# Patient Record
Sex: Female | Born: 1995 | Hispanic: No | Marital: Single
Health system: Northeastern US, Academic
[De-identification: ages and names within clinical notes are randomized; demographics above are authoritative.]

## PROBLEM LIST (undated history)

## (undated) DIAGNOSIS — F509 Eating disorder, unspecified: Secondary | ICD-10-CM

---

## 2017-01-11 ENCOUNTER — Encounter: Payer: Self-pay | Admitting: Emergency Medicine

## 2017-01-11 ENCOUNTER — Emergency Department: Payer: Managed Care, Other (non HMO)

## 2017-01-11 ENCOUNTER — Emergency Department
Admission: EM | Admit: 2017-01-11 | Discharge: 2017-01-11 | Disposition: A | Discharge status: Home / Self Care | Payer: Managed Care, Other (non HMO) | Attending: Emergency Medicine | Admitting: Emergency Medicine

## 2017-01-11 DIAGNOSIS — R42 Dizziness and giddiness: Secondary | ICD-10-CM | POA: Insufficient documentation

## 2017-01-11 DIAGNOSIS — R079 Chest pain, unspecified: Secondary | ICD-10-CM | POA: Diagnosis not present

## 2017-01-11 DIAGNOSIS — Z79899 Other long term (current) drug therapy: Secondary | ICD-10-CM | POA: Diagnosis not present

## 2017-01-11 HISTORY — DX: Eating disorder, unspecified: F50.9

## 2017-01-11 LAB — URINALYSIS, COMPLETE (UACMP) WITH MICROSCOPIC
Bilirubin Urine: NEGATIVE
GLUCOSE, UA: NEGATIVE mg/dL
Hgb urine dipstick: NEGATIVE
KETONES UR: NEGATIVE mg/dL
Nitrite: NEGATIVE
PROTEIN: NEGATIVE mg/dL
Specific Gravity, Urine: 1.014 (ref 1.005–1.030)
pH: 7 (ref 5.0–8.0)

## 2017-01-11 LAB — CBC
HCT: 38.5 % (ref 35.0–47.0)
Hemoglobin: 13.2 g/dL (ref 12.0–16.0)
MCH: 29.3 pg (ref 26.0–34.0)
MCHC: 34.4 g/dL (ref 32.0–36.0)
MCV: 85.2 fL (ref 80.0–100.0)
PLATELETS: 186 10*3/uL (ref 150–440)
RBC: 4.51 MIL/uL (ref 3.80–5.20)
RDW: 13.3 % (ref 11.5–14.5)
WBC: 6.1 10*3/uL (ref 3.6–11.0)

## 2017-01-11 LAB — BASIC METABOLIC PANEL
Anion gap: 5 (ref 5–15)
BUN: 10 mg/dL (ref 6–20)
CALCIUM: 9.1 mg/dL (ref 8.9–10.3)
CO2: 28 mmol/L (ref 22–32)
Chloride: 106 mmol/L (ref 101–111)
Creatinine, Ser: 0.52 mg/dL (ref 0.44–1.00)
GFR calc Af Amer: >60 mL/min (ref >60)
GFR calc non Af Amer: >60 mL/min (ref >60)
GLUCOSE: 93 mg/dL (ref 65–99)
Potassium: 3.7 mmol/L (ref 3.5–5.1)
Sodium: 139 mmol/L (ref 135–145)

## 2017-01-11 LAB — TROPONIN I: Troponin I: <0.03 ng/mL (ref <0.03)

## 2017-01-11 LAB — MAGNESIUM: Magnesium: 1.9 mg/dL (ref 1.7–2.4)

## 2017-01-11 LAB — POCT PREGNANCY, URINE: Preg Test, Ur: NEGATIVE

## 2017-01-11 MED ORDER — IBUPROFEN 400 MG PO TABS
400.0000 mg | ORAL_TABLET | Freq: Once | ORAL | Status: AC
  Administered 2017-01-11: 400 mg via ORAL
  Filled 2017-01-11: qty 1

## 2017-01-11 NOTE — ED Notes (Signed)
AAOx3.  Skin warm and dry.  No SOB/ DOE.  NAD.  Resting comfortably.  Relaxed.

## 2017-01-11 NOTE — Discharge Instructions (Signed)

## 2017-01-11 NOTE — ED Triage Notes (Signed)
Patient ambulatory to triage with steady gait, without difficulty or distress noted; pt st awoke at 230am with left sided CP, and heart racing; denies hx of same

## 2017-01-11 NOTE — ED Notes (Signed)
AAOx3.  Skin warm and dry.  NAD 

## 2017-01-11 NOTE — ED Provider Notes (Signed)
[facility] Emergency Department Provider Note  ____________________________________________  Time seen: Approximately 7:24 AM  I have reviewed the triage vital signs and the nursing notes.   HISTORY  Chief Complaint Chest Pain   HPI Kerri Gay is a 21 y.o. female with a history of eating disorder who presents for evaluation of chest pain. Patient reports that she woke up 2:30 AM with palpitations and left-sided chest pain. She describes the pain as sharp, nonpleuritic, moderate, located in the left side of her chest, nonradiating, constant since this morning. She no longer has palpitations. She reports that she felt lightheaded with her palpitations but no nausea, vomiting, diaphoresis, shortness of breath. She has never had similar episode. She reports that her eating disorder is better control after she's been on Prozac for over a year however she still struggles with that. She was concerned because someone told her that people with eating disorders can develop heart problems at early age. She denies any family history of arrhythmias or heart disease. She denies personal history of blood clots, no recent travel or immobilization, no leg pain or swelling, no hemoptysis, no hormones. No URI symptoms. She reports that her father had a blood clot in the setting of the surgery but other than that no family history of blood clots. Patient reports that she's been having a hard time swallowing her pills and they seem to get stuck on the way down to her stomach and she feels that the pain is coming from that. She does not smoke or use drugs.She denies cough, congestion, fever, abdominal pain, nausea, vomiting, diarrhea, dysuria.  Past Medical History:  Diagnosis Date  . Eating disorder     There are no active problems to display for this patient.   History reviewed. No pertinent surgical history.  Prior to Admission medications   Not on File     Allergies Patient has no known allergies.  No family history on file.  Social History Social History  Substance Use Topics  . Smoking status: Never Smoker  . Smokeless tobacco: Never Used  . Alcohol use Yes    Review of Systems  Constitutional: Negative for fever. + Lightheadedness Eyes: Negative for visual changes. ENT: Negative for sore throat. Neck: No neck pain  Cardiovascular: + chest pain. Respiratory: Negative for shortness of breath. Gastrointestinal: Negative for abdominal pain, vomiting or diarrhea. Genitourinary: Negative for dysuria. Musculoskeletal: Negative for back pain. Skin: Negative for rash. Neurological: Negative for headaches, weakness or numbness. Psych: No SI or HI  ____________________________________________   PHYSICAL EXAM:  VITAL SIGNS: ED Triage Vitals  Enc Vitals Group     BP 01/11/17 0433 116/68     Pulse Rate 01/11/17 0433 72     Resp 01/11/17 0433 18     Temp 01/11/17 0433 98.7 F (37.1 C)     Temp Source 01/11/17 0433 Oral     SpO2 01/11/17 0433 100 %     Weight 01/11/17 0430 103 lb (46.7 kg)     Height 01/11/17 0430 5\' 5"  (1.651 m)     Head Circumference --      Peak Flow --      Pain Score 01/11/17 0430 7     Pain Loc --      Pain Edu? --      Excl. in GC? --     Constitutional: Alert and oriented. Well appearing and in no apparent distress. HEENT:      Head: Normocephalic and atraumatic.  Eyes: Conjunctivae are normal. Sclera is non-icteric. EOMI. PERRL      Mouth/Throat: Mucous membranes are moist.       Neck: Supple with no signs of meningismus. Cardiovascular: Regular rate and rhythm. No murmurs, gallops, or rubs. 2+ symmetrical distal pulses are present in all extremities. No JVD. Respiratory: Normal respiratory effort. Lungs are clear to auscultation bilaterally. No wheezes, crackles, or rhonchi.  Gastrointestinal: Soft, non tender, and non distended with positive bowel sounds. No rebound or  guarding. Genitourinary: No CVA tenderness. Musculoskeletal: Nontender with normal range of motion in all extremities. No edema, cyanosis, or erythema of extremities. Neurologic: Normal speech and language. Face is symmetric. Moving all extremities. No gross focal neurologic deficits are appreciated. Skin: Skin is warm, dry and intact. No rash noted. Psychiatric: Mood and affect are normal. Speech and behavior are normal.  ____________________________________________   LABS (all labs ordered are listed, but only abnormal results are displayed)  Labs Reviewed  URINALYSIS, COMPLETE (UACMP) WITH MICROSCOPIC - Abnormal; Notable for the following:       Result Value   Color, Urine YELLOW (*)    APPearance CLOUDY (*)    Leukocytes, UA LARGE (*)    Bacteria, UA RARE (*)    Squamous Epithelial / LPF 6-30 (*)    All other components within normal limits  URINE CULTURE  BASIC METABOLIC PANEL  CBC  TROPONIN I  MAGNESIUM  TROPONIN I  POCT PREGNANCY, URINE   ____________________________________________  EKG  ED ECG REPORT I, Nita Sickle, the attending physician, personally viewed and interpreted this ECG.  Normal sinus rhythm, rate of 70, normal intervals, normal axis, no ST elevations or depressions, diffuse T-wave flattening. No prior for comparison.  ____________________________________________  RADIOLOGY  CXR: Negative  ____________________________________________   PROCEDURES  Procedure(s) performed: None Procedures Critical Care performed:  None ____________________________________________   INITIAL IMPRESSION / ASSESSMENT AND PLAN / ED COURSE  21 y.o. female with a history of eating disorder who presents for evaluation of sharp non pleuritic constant left sided chest pain associated with palpitations.  Chest pain in a 21 y.o. female with low suspicion for cardiac (HEART score 0) or other serious etiology (including aortic dissection, pneumonia, pneumothorax,  or pulmonary embolism) based her history and physical exam in the ED today. PERC negative. EKG normal. Plan for labs including CBC, chemistries and troponin now and in 3 hours, CXR and re-evaluation for disposition. Will observe patient on cardiac monitor for any arrhythmias while in the ED and pain control.     Clinical Course as of Jan 11 822  Tue Jan 11, 2017  1610 Patient has normal electrolytes. Troponin 2 is negative. Monitor on telemetry with no arrhythmias. She is can be discharged home with follow-up with primary care doctor.  [CV]  0820 UA with regular bacteria and leuks. Patient has no symptoms of urinary tract infection. We'll hold off treatment at this time and sent for culture.  [CV]    Clinical Course User Index [CV] Nita Sickle, MD    Pertinent labs & imaging results that were available during my care of the patient were reviewed by me and considered in my medical decision making (see chart for details).    ____________________________________________   FINAL CLINICAL IMPRESSION(S) / ED DIAGNOSES  Final diagnoses:  Chest pain, unspecified type      NEW MEDICATIONS STARTED DURING THIS VISIT:  New Prescriptions   No medications on file     Note:  This document was prepared using Dragon  voice recognition software and may include unintentional dictation errors.    Nita Sicklearolina Deana Krock, MD 01/11/17 [phone]

## 2017-01-12 LAB — URINE CULTURE

## 2019-01-09 ENCOUNTER — Emergency Department: Payer: Managed Care, Other (non HMO)

## 2019-01-09 ENCOUNTER — Encounter: Payer: Self-pay | Admitting: Emergency Medicine

## 2019-01-09 ENCOUNTER — Emergency Department
Admission: EM | Admit: 2019-01-09 | Discharge: 2019-01-09 | Disposition: A | Discharge status: Home / Self Care | Payer: Managed Care, Other (non HMO) | Attending: Emergency Medicine | Admitting: Emergency Medicine

## 2019-01-09 ENCOUNTER — Other Ambulatory Visit: Payer: Self-pay

## 2019-01-09 DIAGNOSIS — S6992XA Unspecified injury of left wrist, hand and finger(s), initial encounter: Secondary | ICD-10-CM | POA: Diagnosis present

## 2019-01-09 DIAGNOSIS — Y998 Other external cause status: Secondary | ICD-10-CM | POA: Insufficient documentation

## 2019-01-09 DIAGNOSIS — Y929 Unspecified place or not applicable: Secondary | ICD-10-CM | POA: Diagnosis not present

## 2019-01-09 DIAGNOSIS — Y9351 Activity, roller skating (inline) and skateboarding: Secondary | ICD-10-CM | POA: Insufficient documentation

## 2019-01-09 DIAGNOSIS — S52125A Nondisplaced fracture of head of left radius, initial encounter for closed fracture: Secondary | ICD-10-CM | POA: Diagnosis not present

## 2019-01-09 MED ORDER — NAPROXEN 500 MG PO TABS
500.0000 mg | ORAL_TABLET | Freq: Once | ORAL | Status: AC
  Administered 2019-01-09: 500 mg via ORAL
  Filled 2019-01-09: qty 1

## 2019-01-09 MED ORDER — TRAMADOL HCL 50 MG PO TABS
50.0000 mg | ORAL_TABLET | Freq: Once | ORAL | Status: AC
  Administered 2019-01-09: 50 mg via ORAL
  Filled 2019-01-09: qty 1

## 2019-01-09 NOTE — ED Notes (Signed)
See triage note  Presents s/p fall from skateboard  Landed on left side and wrist  Possible deformity to wrist  Good pulses   Also having some pain to left hip and upper leg  Ambulates with slight limp

## 2019-01-09 NOTE — ED Provider Notes (Signed)
[facility] Emergency Department Provider Note ____________________________________________  Time seen: Approximately 5:31 PM  I have reviewed the triage vital signs and the nursing notes.   HISTORY  Chief Complaint Wrist Injury    HPI Kerri Gay is a 23 y.o. female who presents to the emergency department for evaluation and treatment of wrist pain. She fell off her skateboard and landed with left hand outstretched. She has pain in the left wrist and elbow. No alleviating measures attempted prior to arrival.  Past Medical History:  Diagnosis Date  . Eating disorder     There are no active problems to display for this patient.   History reviewed. No pertinent surgical history.  Prior to Admission medications   Not on File    Allergies Patient has no known allergies.  No family history on file.  Social History Social History   Tobacco Use  . Smoking status: Never Smoker  . Smokeless tobacco: Never Used  Substance Use Topics  . Alcohol use: Yes  . Drug use: Not on file    Review of Systems Constitutional: Negative for fever. Cardiovascular: Negative for chest pain. Respiratory: Negative for shortness of breath. Musculoskeletal: Positive for left elbow and wrist pain. Skin: Negative for open wound or lesion.  Neurological: Negative for decrease in sensation  ____________________________________________   PHYSICAL EXAM:  VITAL SIGNS: ED Triage Vitals  Enc Vitals Group     BP 01/09/19 1708 108/75     Pulse Rate 01/09/19 1708 86     Resp --      Temp 01/09/19 1708 98.2 F (36.8 C)     Temp Source 01/09/19 1708 Oral     SpO2 01/09/19 1708 98 %     Weight 01/09/19 1710 110 lb (49.9 kg)     Height 01/09/19 1710 5\' 5"  (1.651 m)     Head Circumference --      Peak Flow --      Pain Score 01/09/19 1710 10     Pain Loc --      Pain Edu? --      Excl. in GC? --     Constitutional: Alert and oriented. Well appearing  and in no acute distress. Eyes: Conjunctivae are clear without discharge or drainage Head: Atraumatic Neck: Supple. Respiratory: No cough. Respirations are even and unlabored. Musculoskeletal: Unable to fully extend left elbow secondary to pain. No obvious deformity of the elbow. No deformity of the left wrist or hand. Limited ROM of the left wrist due to pain. Neurologic: Motor and sensory function of the left upper extremity is intact.  Skin: No open wounds or lesions.  Psychiatric: Affect and behavior are appropriate.  ____________________________________________   LABS (all labs ordered are listed, but only abnormal results are displayed)  Labs Reviewed - No data to display ____________________________________________  RADIOLOGY  Image of the left wrist is negative for fracture. Image of the left elbow shows a nondisplaced radial head fracture. ____________________________________________   PROCEDURES  .Ortho Injury Treatment Date/Time: 01/09/2019 6:43 PM Performed by: Marguerita Merles, NT Authorized by: Chinita Pester, FNP   Consent:    Consent obtained:  Verbal   Consent given by:  Patient   Risks discussed:  Stiffness and restricted joint movementInjury location: elbow Location details: left elbow Injury type: fracture Fracture type: radial head Pre-procedure distal perfusion: normal Pre-procedure neurological function: normal Pre-procedure range of motion: reduced Manipulation performed: no Immobilization: sling Post-procedure distal perfusion: normal Post-procedure neurological function: normal Post-procedure range  of motion: unchanged Patient tolerance: Patient tolerated the procedure well with no immediate complications     ____________________________________________   INITIAL IMPRESSION / ASSESSMENT AND PLAN / ED COURSE  Kerri Gay is a 23 y.o. who presents to the emergency department for treatment and evaluation of left wrist and  elbow pain after mechanical, non-syncopal fall from her skateboard earlier today. Image of the wrist is reassuring, but the elbow shows a nondisplaced radial head fracture. She will be placed in a sling and given a prescription for tramadol and naprosyn. She is to follow up with orthopedics. She is to return to the ER for symptoms that change or worsen if unable to schedule an appointment.  Medications  naproxen (NAPROSYN) tablet 500 mg (500 mg Oral Given 01/09/19 1852)  traMADol (ULTRAM) tablet 50 mg (50 mg Oral Given 01/09/19 1852)    Pertinent labs & imaging results that were available during my care of the patient were reviewed by me and considered in my medical decision making (see chart for details).  _________________________________________   FINAL CLINICAL IMPRESSION(S) / ED DIAGNOSES  Final diagnoses:  Closed nondisplaced fracture of head of left radius, initial encounter    ED Discharge Orders    None       If controlled substance prescribed during this visit, 12 month history viewed on the [facility] prior to issuing an initial prescription for Schedule II or III opiod.   Chinita Pester, FNP 01/09/19 1919    Emily Filbert, MD 01/09/19 2011

## 2019-01-09 NOTE — Discharge Instructions (Signed)
You will need to wear the sling for a week. After a week, you need to start moving your arm, but avoid lifting for the next week or so.   I do not recommend that you skate until you are completely pain free.   Follow up with the orthopedic doctor. Call tomorrow to schedule a follow up appointment.

## 2019-01-09 NOTE — ED Triage Notes (Signed)
PT arrives with complaints of mechanical injury to left wrist 30 minutes prior to arrival. Pulse present in triage.

## 2019-01-09 NOTE — ED Notes (Signed)
Arm sling applied by NP. Patient tolerated well.

## 2019-08-03 IMAGING — CR DG ELBOW COMPLETE 3+V*L*
1 series · 4 of 4 positions shown · non-contrast
Comparison: None.

CLINICAL DATA: Patient fell from skateboard hurting left arm.

EXAM:
LEFT ELBOW - COMPLETE 3+ VIEW

[Series 2: x elbow ap left · 0.14mm/px · 4 of 4 slices shown]
[im 1/4]
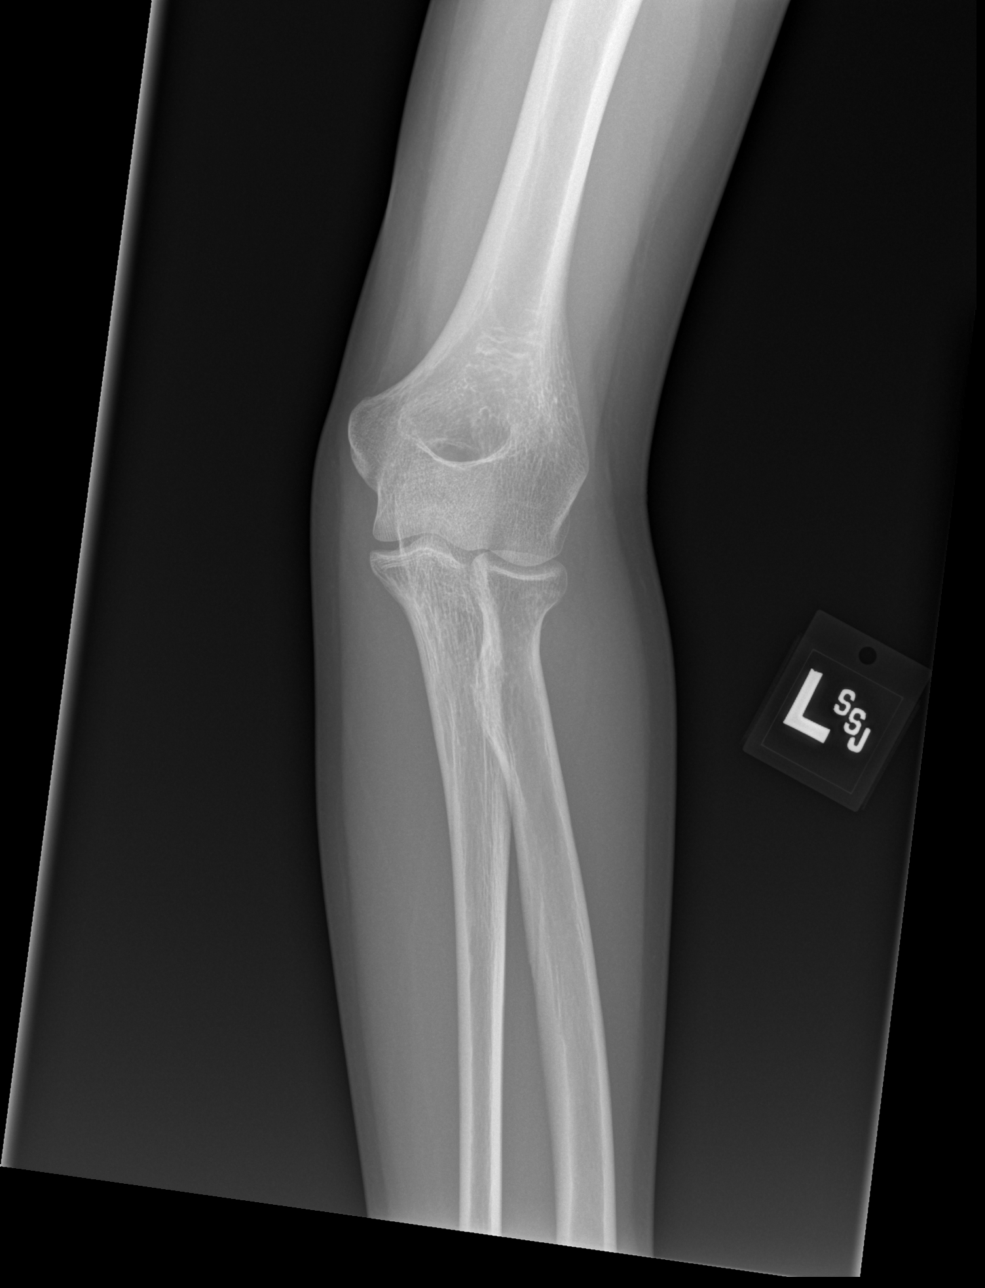
[im 2/4]
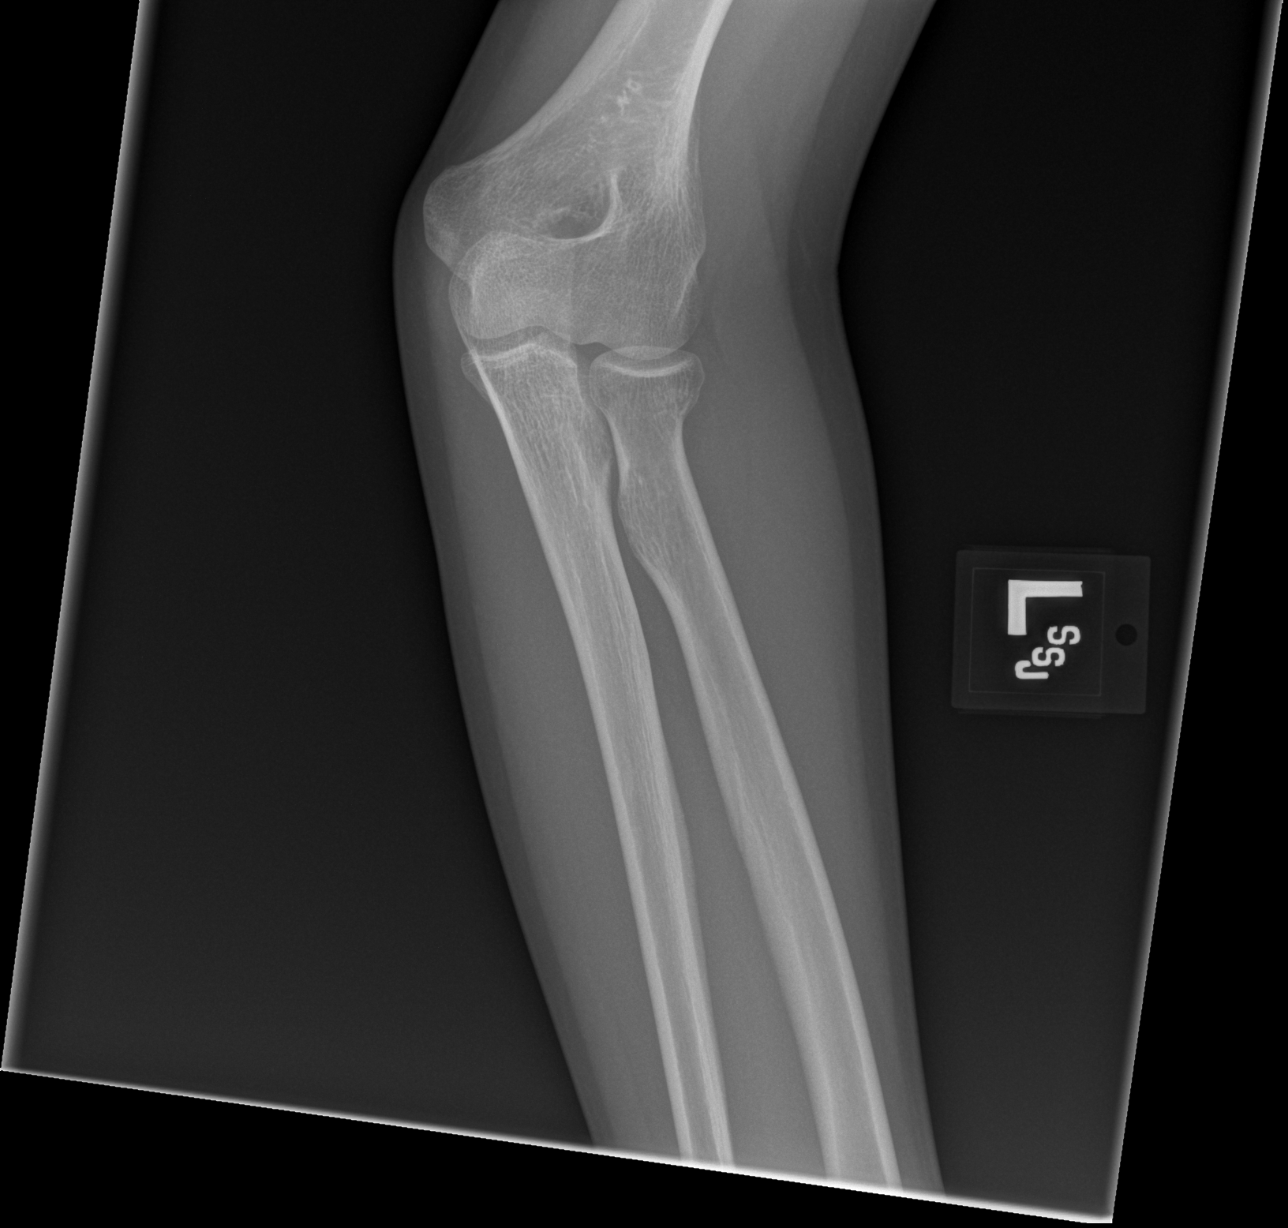
[im 3/4]
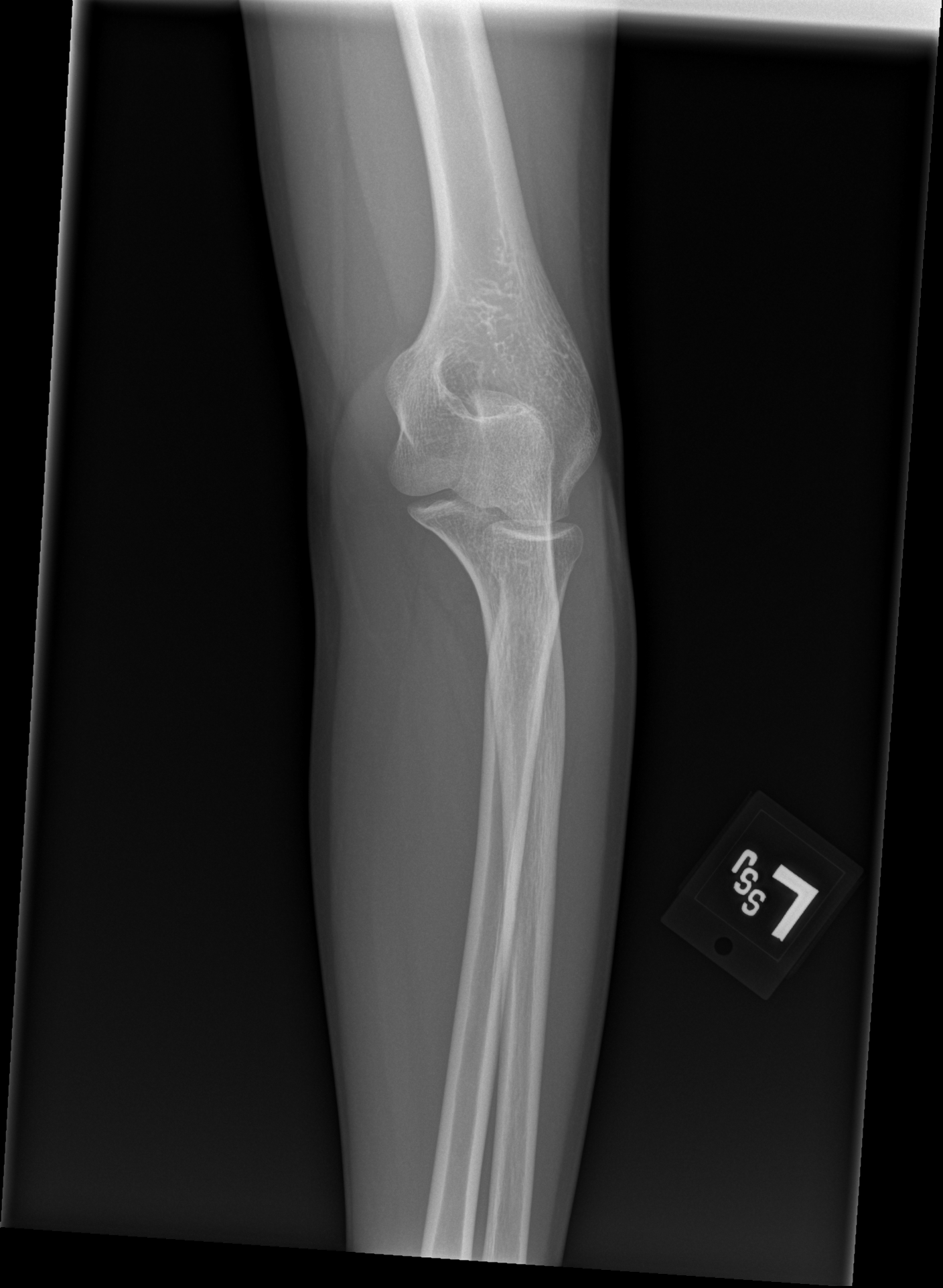
[im 4/4]
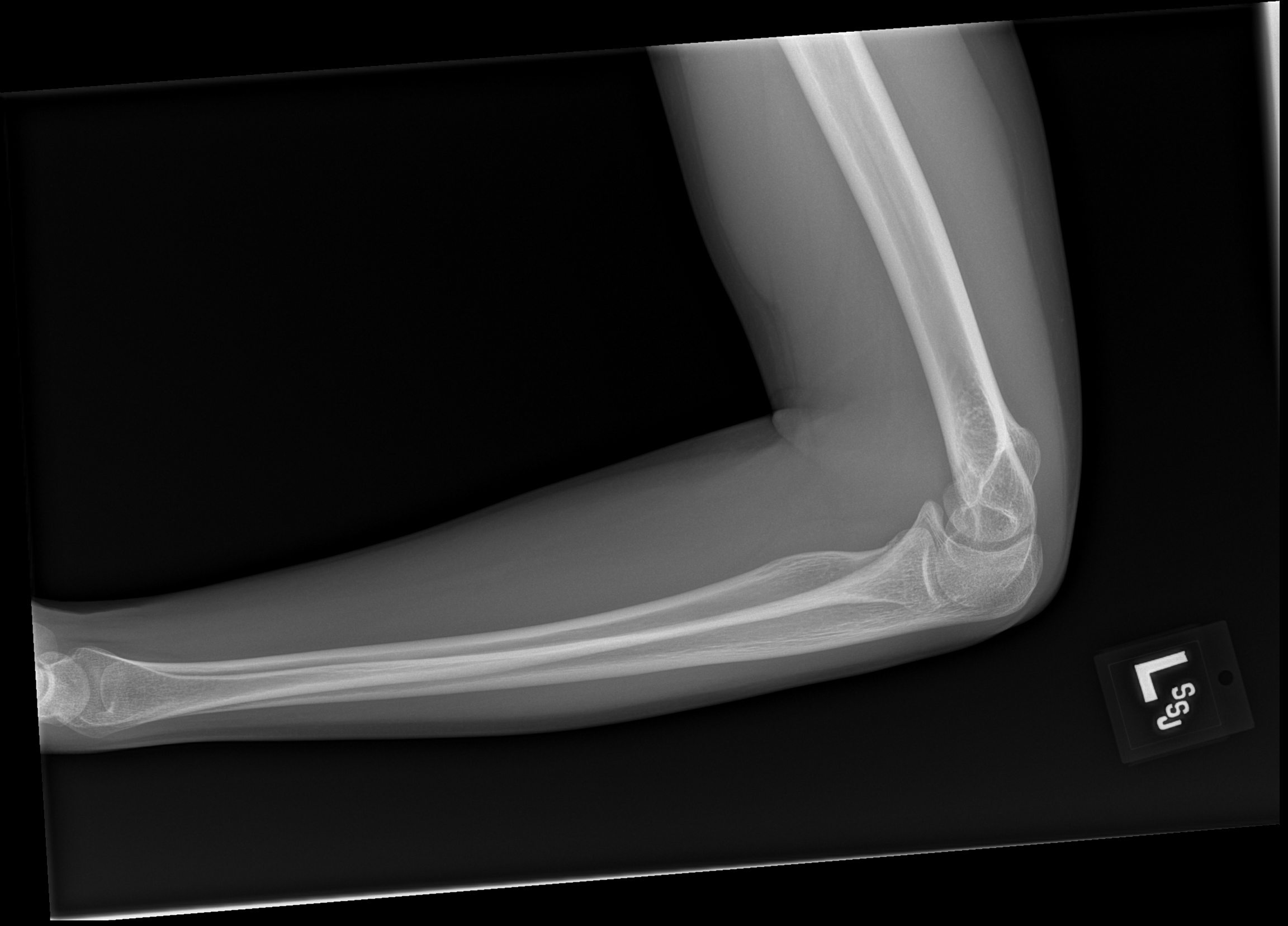

[4 of 4 positions shown; findings below may reference images not displayed]

FINDINGS: A subtle transverse lucency involving the neck of the radius is
noted without definite cortical break though there is slight
cortical irregularity. There is lack of a joint effusion. Findings
may represent an area of osteopenia though the possibility of a
nondisplaced fracture can not be entirely excluded. No joint
dislocation, soft tissue mass, mineralization or significant soft
tissue swelling is seen.
IMPRESSION: Subtle transverse lucency involving the neck of the radius without
displacement or apparent joint effusion. Osteopenia versus
nondisplaced fracture. Follow-up radiographs in 7-10 days, CT or MRI
may prove useful in further assessment as clinically necessary.

## 2019-08-03 IMAGING — DX DG WRIST COMPLETE 3+V*L*
4 series · 4 of 4 positions shown · non-contrast
Comparison: None

CLINICAL DATA: Fell off skateboard 1 hour ago landing on LEFT
wrist, pain at wrist radiating up arm

EXAM:
LEFT WRIST - COMPLETE 3+ VIEW

[wrist ap (1 of 2)]
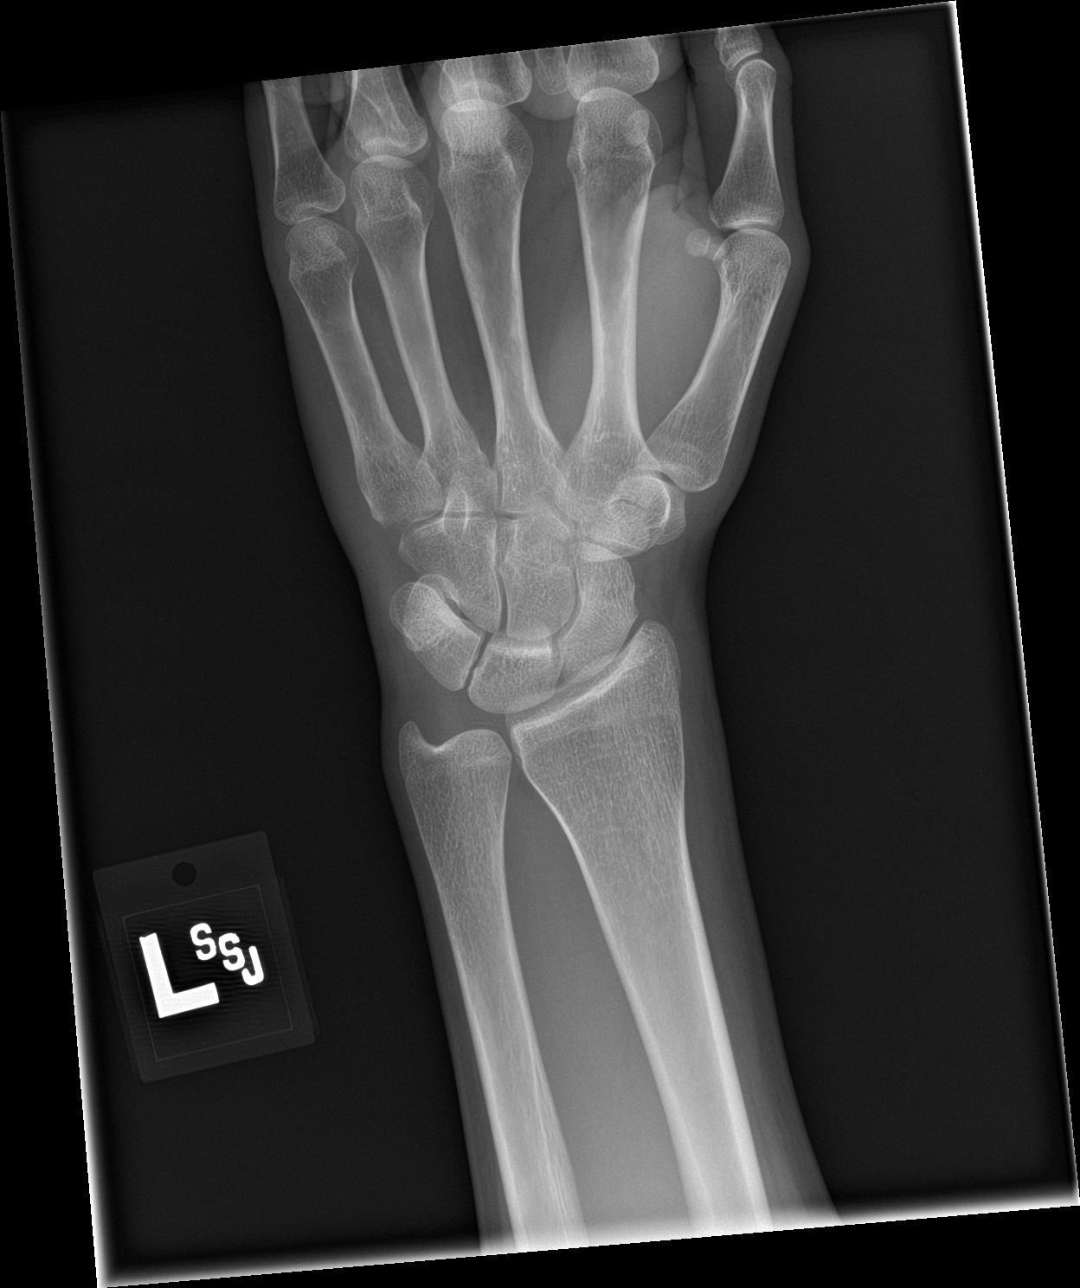

[wrist obl]
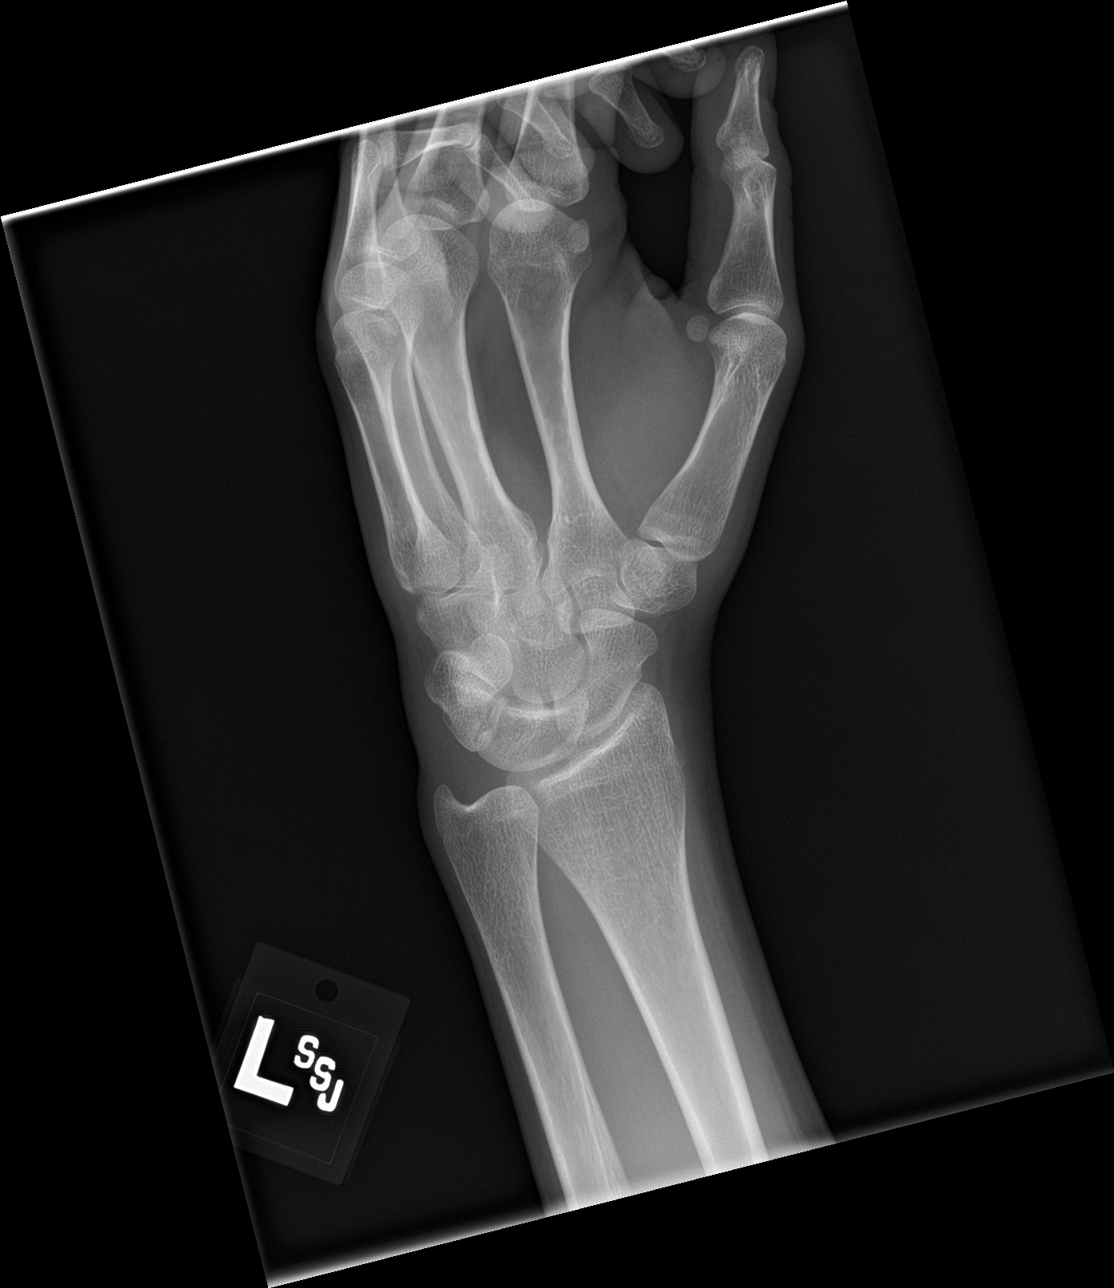

[wrist lat]
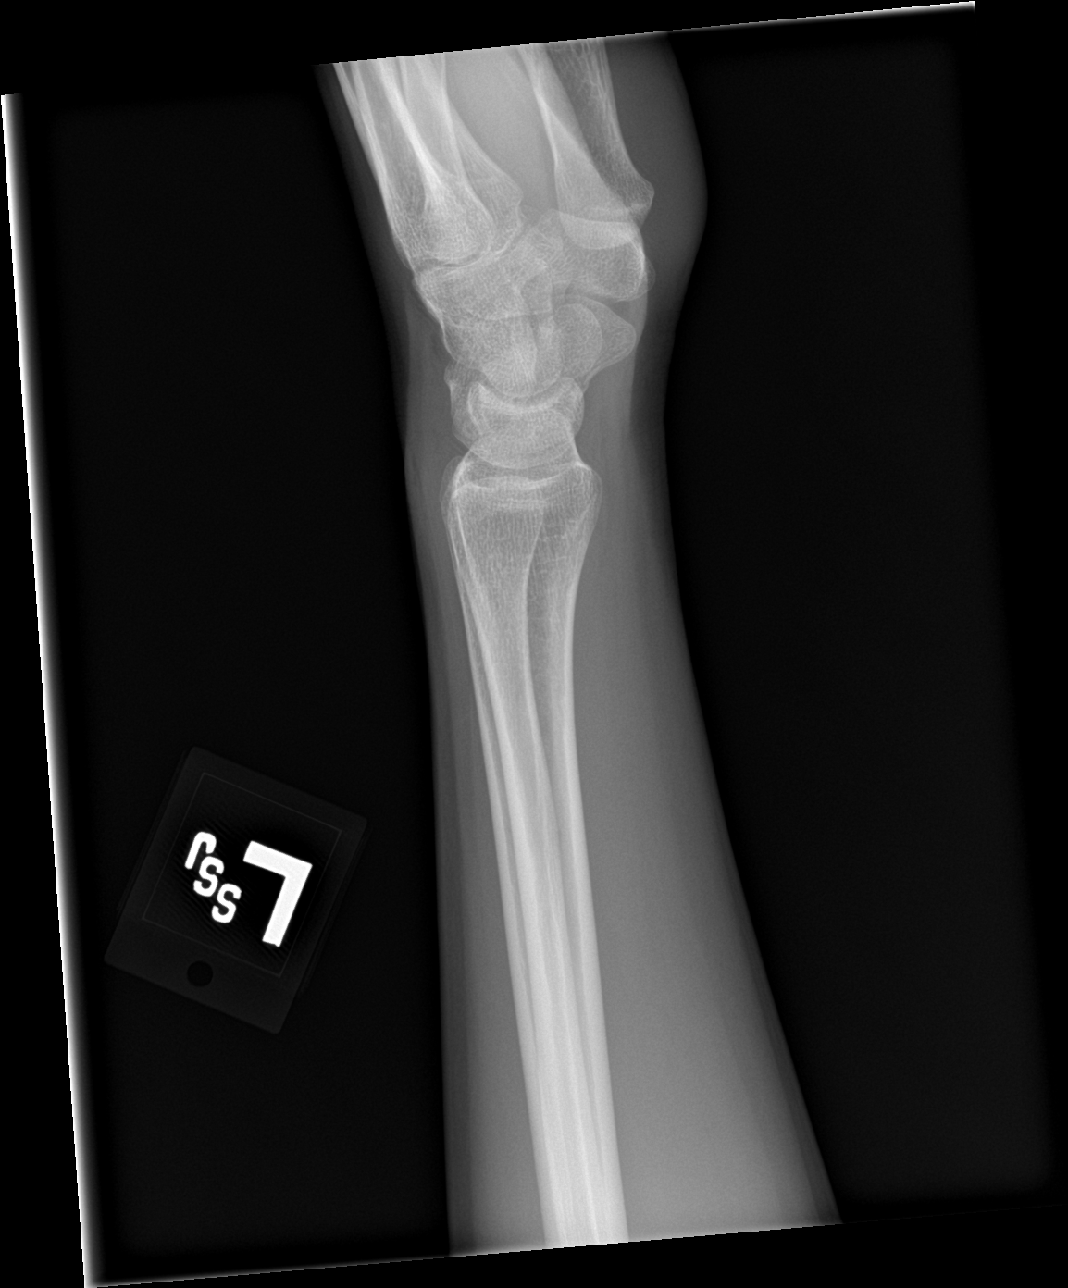

[wrist ap (2 of 2)]
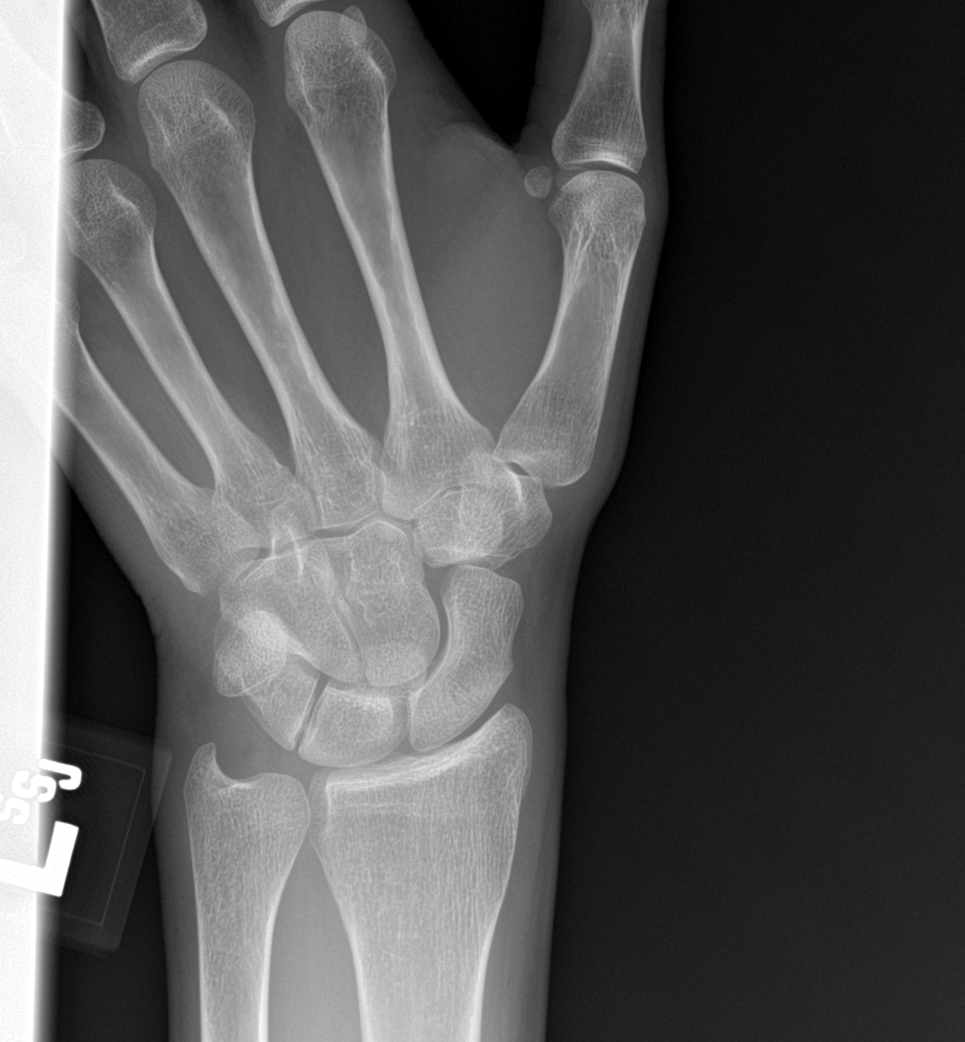

[4 of 4 positions shown; findings below may reference images not displayed]

FINDINGS: Osseous mineralization normal.

Joint spaces preserved.

No fracture, dislocation, or bone destruction.
IMPRESSION: Normal exam.

## 2020-08-13 ENCOUNTER — Encounter: Admit: 2020-08-13 | Payer: PRIVATE HEALTH INSURANCE | Attending: Psychiatry | Primary: Adolescent Medicine

## 2020-08-13 ENCOUNTER — Inpatient Hospital Stay: Admit: 2020-08-13 | Discharge: 2020-08-15 | Payer: PRIVATE HEALTH INSURANCE | Primary: Adolescent Medicine

## 2020-08-13 DIAGNOSIS — Z789 Other specified health status: Secondary | ICD-10-CM

## 2020-08-13 DIAGNOSIS — F332 Major depressive disorder, recurrent severe without psychotic features: Secondary | ICD-10-CM

## 2020-08-13 DIAGNOSIS — F333 Major depressive disorder, recurrent, severe with psychotic symptoms: Secondary | ICD-10-CM

## 2020-08-13 LAB — COMPREHENSIVE METABOLIC PANEL
BKR A/G RATIO: 1.3
BKR ALANINE AMINOTRANSFERASE (ALT): 22 U/L (ref 12–78)
BKR ALBUMIN: 4.1 g/dL (ref 3.4–5.0)
BKR ALKALINE PHOSPHATASE: 72 U/L (ref 20–120)
BKR ANION GAP: 9 % (ref 5–18)
BKR ASPARTATE AMINOTRANSFERASE (AST): 14 U/L (ref 5–37)
BKR AST/ALT RATIO: 0.6
BKR BILIRUBIN TOTAL: 0.9 mg/dL (ref 0.0–1.0)
BKR BLOOD UREA NITROGEN: 13 mg/dL (ref 8–25)
BKR BUN / CREAT RATIO: 20.3 % (ref 8.0–25.0)
BKR CALCIUM: 10.2 mg/dL (ref 8.4–10.3)
BKR CHLORIDE: 106 mmol/L (ref 95–115)
BKR CO2: 23 mmol/L (ref 21–32)
BKR CREATININE: 0.64 mg/dL (ref 0.50–1.30)
BKR EGFR (AFR AMER): >60 mL/min/{1.73_m2} (ref >60)
BKR EGFR (NON AFRICAN AMERICAN): >60 mL/min/{1.73_m2} (ref >60)
BKR GLOBULIN: 3.1 g/dL
BKR GLUCOSE: 85 mg/dL (ref 70–100)
BKR OSMOLALITY CALCULATION: 275 mOsm/kg (ref 275–295)
BKR POTASSIUM: 3.8 mmol/L (ref 3.5–5.1)
BKR PROTEIN TOTAL: 7.2 g/dL (ref 6.4–8.2)
BKR SODIUM: 138 mmol/L (ref 136–145)
BKR WAM BASOPHIL ABSOLUTE COUNT: 3.1 g/dL (ref 0.0–0.6)
BKR WAM MCV: 106 mmol/L (ref 95–115)
BKR WAM NEUTROPHILS: 10.2 mg/dL (ref 8.4–10.3)

## 2020-08-13 LAB — COVID-19 CLEARANCE OR FOR PLACEMENT ONLY: BKR SARS-COV-2 RNA (COVID-19) (YH): NEGATIVE

## 2020-08-13 LAB — CBC WITH AUTO DIFFERENTIAL
BKR WAM ABSOLUTE IMMATURE GRANULOCYTES: 0 x 1000/ÂµL (ref 0.0–0.2)
BKR WAM ABSOLUTE LYMPHOCYTE COUNT: 1.2 x 1000/ÂµL (ref 1.0–2.3)
BKR WAM ABSOLUTE NRBC: 0 x 1000/ÂµL (ref <=0.0)
BKR WAM ANALYZER ANC: 3.5 x 1000/ÂµL (ref 1.8–7.3)
BKR WAM BASOPHILS: 0.8 % (ref 0.0–2.0)
BKR WAM EOSINOPHIL ABSOLUTE COUNT: 0 x 1000/ÂµL (ref 0.0–0.4)
BKR WAM EOSINOPHILS: 0.2 % (ref 0.0–6.0)
BKR WAM HEMATOCRIT: 40 % (ref 36.0–48.0)
BKR WAM HEMOGLOBIN: 13.7 g/dL (ref 11.9–16.0)
BKR WAM IMMATURE GRANULOCYTES: 0.2 % (ref 0.0–2.0)
BKR WAM LYMPHOCYTES: 23 % (ref 14.0–43.0)
BKR WAM MCH (PG): 29.1 pg (ref 25.7–31.0)
BKR WAM MCHC: 34.3 g/dL (ref 32.0–36.0)
BKR WAM MONOCYTE ABSOLUTE COUNT: 0.3 x 1000/??L — ABNORMAL LOW (ref 0.4–1.3)
BKR WAM MONOCYTES: 6.1 % (ref 0.0–14.0)
BKR WAM MPV: 10.1 fL (ref 9.8–12.3)
BKR WAM NUCLEATED RED BLOOD CELLS: 0 % (ref 0.0–0.2)
BKR WAM PLATELETS: 208 x1000/ÂµL (ref 140–446)
BKR WAM RDW-CV: 13 % (ref 11.5–14.5)
BKR WAM RED BLOOD CELL COUNT: 4.7 M/??L (ref 4.2–5.4)
BKR WAM WHITE BLOOD CELL COUNT: 5.1 x1000/ÂµL (ref 3.8–10.6)

## 2020-08-13 LAB — ZZZURINE DRUG SCREEN      (GH)
BKR AMPHETAMINE SCREEN, URINE: NEGATIVE
BKR BARBITURATE SCREEN, URINE: NEGATIVE
BKR BENZODIAZEPINE SCREEN, URINE: NEGATIVE
BKR CANNABINOID SCREEN, URINE: NEGATIVE
BKR COCAINE SCREEN, URINE: NEGATIVE
BKR ETHANOL URINE: NEGATIVE
BKR METHADONE SCREEN, URINE: NEGATIVE
BKR OPIATE SCREEN, URINE: NEGATIVE
BKR PHENCYCLIDINE SCREEN, URINE: NEGATIVE

## 2020-08-13 LAB — SALICYLATE LEVEL: BKR SALICYLATE LEVEL: <1.7 mg/dL — ABNORMAL LOW (ref 2.8–20.0)

## 2020-08-13 LAB — HCG, SERUM, QUALITATIVE (BH GH L LMW): BKR SERUM PREGNANCY-QUALITATIVE: NEGATIVE

## 2020-08-13 LAB — ACETAMINOPHEN LEVEL: BKR ACETAMINOPHEN LEVEL: <2 ug/mL — ABNORMAL LOW (ref 10–30)

## 2020-08-13 LAB — ETHANOL     (BH GH L LMW YH): BKR ETHANOL: <3 mg/dL (ref 0–3)

## 2020-08-13 MED ORDER — OLANZAPINE 2.5 MG TABLET
2.5 mg | Freq: Two times a day (BID) | ORAL | Status: DC
Start: 2020-08-13 — End: 2020-08-15
  Administered 2020-08-14 – 2020-08-15 (×4): 2.5 mg via ORAL

## 2020-08-13 NOTE — Other
7:45 PMI received a call from the patient's psychiatrist, Janalyn Rouse [phone], who is requesting that the psychiatrist call him tomorrow morning.Reports that the patient was paranoid and psychotic this morning. Katha Cabal [place], MD09/22/21 1946

## 2020-08-14 MED ORDER — FLUOXETINE 10 MG CAPSULE
10 mg | Freq: Every day | ORAL | Status: DC
Start: 2020-08-14 — End: 2020-08-15
  Administered 2020-08-14 – 2020-08-15 (×2): 10 mg via ORAL

## 2020-08-14 NOTE — Other
[facility] EMERGENCY DEPTEmergency Department Psychiatry Progress Note9/23/2021Patient Name:  Brooke StaffordMRN:  [id number] Patient Class:  ObservationSUBJECTIVE Interim History: Met with patient and father this morning.  Case was discussed with clinical team.  Writer spoke with patient's outside psychiatrist Dr. Janalyn Rouse [phone] who provided additional information.  No changes.  No improvement.  Patient remained depressed, angry, irritable, labile, disorganized and paranoid.  Refusing Zyprexa.  Later patient agreed to take it.  It took a lots of time to convince patient to take her medication.  Father was supportive.  Treatment plan discussed with the patient, family, treatment team and outpatient psychiatrist Dr. Deretha Emory.  Admission plan is still in place. Still no beds available at this point. Possible bed availability at [facility] tomorrow. Will increase Zyprexa to 5 mg po BID. Will resume Prozac 10 mg po daily.Review of Systems Psychiatric/Behavioral: Positive for agitation, behavioral problems, decreased concentration, dysphoric mood and suicidal ideas. The patient is nervous/anxious.  OBJECTIVE I have reviewed the patient's current medications, allergies, past medical history, past surgical history, family history, social history and inpatient psychiatric admission.Current MedicationsCurrent Facility-Administered Medications Medication Dose Route Frequency Provider Last Rate Last Admin ? FLUoxetine (PROzac) capsule 10 mg  10 mg Oral Daily Tajanay Hurley, MD     ? OLANZapine (ZyPREXA) tablet 2.5 mg  2.5 mg Oral BID Stedman [place], MD   2.5 mg at 08/14/20 1140 No current outpatient medications on file. Vital SignsVitals:  08/14/20 1038 BP: 105/71 Pulse: 81 Resp: (!) 12 Temp: 98.2 ?F (36.8 ?C) Mental Status ExamGeneral AppearanceHabitus:  ThinGrooming: FairMusculoskeletalStrength and Tone: Strength normalGait and Station: Stable gaitPsychiatricAttitude: UncooperativeMood: depressed, anxious, apathetic, irritableAffect: Congruent to reported moodThought Process: circumstantial, disorganized, thought blocking and impoverishedAssociations: loose associationsThought Content: paranoid delusions, ideas of referenceSuicidal Ideation: No current suicidal plan, ideation or intentHomicidal Ideation: No current homicidal ideation, plan or intentJudgment:  PoorInsight:  PoorCognitive EvaluationOrientation: Oriented to person, oriented to place and oriented to date/time Attention and Concentration:  Decreased attention and concentrationAbstract Reasoning: Decreased capacity for abstract reasoningLab Results (last 24 hours)No results found for this or any previous visit (from the past 24 hour(s)).ASSESSMENT [place] - Suicide Severity Screen    Most Recent Value Have you wished you were dead or wished you could go to sleep and not wake up? no Have you actually had any thoughts of killing yourself? no Have you ever done anything, started to do anything, or prepared to do anything to end your life? 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  Suicide Attempt:  No Prior Attempts  Presenting Symptoms:  Hopelessness or Despair:  Yes  Anxiety and/or Panic:  Yes  Psychosis:  Yes  Family History:      Defer for Further Assessment  Precipitants / Stressors:      Unable to Assess Change in Treatment:       Unable to AssessCurrent and Past Psychiatric Diagnoses:  Mood Disorder:  Recurrent/Current  Psychotic Disorder:  New Onset  Alcohol/Substance Abuse Disorders:  Recurrent/Current  ADHD:  Recurrent/Current  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  Suicide Attempt:  No Prior AttemptsProtective Factors:   Internal:  Unable to Assess  External:      Unable to AssessRisk to Self - Self-Injurious Behavior:   Current Urges to harm Self:  No  Recent Self-Injury:  No  History of Self Injury:  No  Imminent Risk for Self Injury in Community:  Low  Imminent Risk for Self Injury in Facility:  LowRisk to Others:   Current Agitation:  No  Homicidal/Aggressive Ideation:  No  Homicidal/Aggressive Threat/Plan:  No  Recent Violence/Aggression:  No  Imminent Risk for Violence in Community:  Low  Imminent Risk for Violence in Facility:  LowWithdrawal Risk:   Alcohol / Benzodiazepines / Barbiturates:  Not applicable     Alcohol/Benzodiazepine/Barbiturate Risk for Withdrawal:  Absent  Opioids:  Not applicable     Opioid Risk for Withdrawal:  AbsentPsychiatric DiagnosisActive Hospital Problems  Diagnosis ? 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 Alcohol use [Z72.89]   Chronic PLAN Continue with admission planResume Prozac 10 mg po dailyIncrease Zyprexa 5 mg po BIDLegal Status:  Physicians Emergency Certificate [facility]) - InpatientElectronically SignedProvider:  Sheffield Hawker, MD9/23/20213:27 PM

## 2020-08-14 NOTE — Other
EMERGENCY DEPARTMENT	                          Behavioral Health Unit Observation NoteChief Complaint Patient presents with ? Depression   non compliant with prozac   has been speaking to psychiatrist all week but not tellin him truth about feeling more depressed and not wanting to live this life anymore. no plan arrives awake and alert no eye contact speaking softly. Past Medical History: Diagnosis Date ? Alcohol use 08/14/2020 ? Severe episode of recurrent major depressive disorder, with psychotic features (HC Code) 08/13/2020 ? Severe episode of recurrent major depressive disorder, without psychotic features (HC Code) 08/13/2020 Home medications:Patient's Medications  No medications on file Current medications:Medications OLANZapine (ZyPREXA) tablet 2.5 mg (2.5 mg Oral Given 08/13/20 2140) Allergies as of 08/13/2020 ? (No Known Allergies) The patient's home medication list has been reviewed.________________________________________________________________________Physical exam: Vital signs:	BP 108/65  - Pulse 78  - Temp 98.1 ?F (36.7 ?C) (Oral)  - Resp 16  - SpO2 98% 	General Appear:	alert, no distress, well nourished and well developed	Neurologic:		is nonfocal and is alert and oriented x3. Gait normal. 	Plan:Home medications were reviewed and ordered: yesPatient is in our Behavioral Health Unit.Patient has been medically cleared.Psychiatry consulted.The patient is located in our Behavioral Health Unit.  They are not at high risk for suicide/self-harm due to the risk mitigation interventions within this unit. Altamese Cabal, MD09/23/21 [phone]

## 2020-08-14 NOTE — ED Notes
10:29 PM Received report from prior RN, assumed care, pt updated on plan and disposition. Pt appears in no respiratory distress, respirations even and unlabored. Denies SI, reports feeling fine. All due medications as ordered, questioning medication change from her normal medication Prozac. Will continue to assess.
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
## 2020-08-14 NOTE — Other
Patient evaluated by DR.Kurjakovic. PEC'd for in-patient psych admission. [employer]. Will start bed search.[facility]- fax clinical for review 345pm patient accepted for admission 08/15/20 - requesting updated Covid, vitals, nursing report, and insurance authorization [place]- [place] bed [place]-per Morrie Sheldon- no bed  [facility]- per Diannia Ruder- will call back after 10 am[place]- per Diannia Ruder- no bed [facility] [place]  [facility]- per [facility]- per Cristal Ford- call back after 1030am[facility] per Marlowe Sax- [facility] bed[facility]- per [facility]- no bed [place]- per Casimiro Needle- no bed Will complete transfer in am. Morrie Sheldon Psych Case Management [phone]

## 2020-08-15 LAB — COVID-19 CLEARANCE OR FOR PLACEMENT ONLY: BKR SARS-COV-2 RNA (COVID-19) (YH): NEGATIVE

## 2020-08-15 MED ORDER — FLUOXETINE 20 MG CAPSULE
20 mg | Status: DC
Start: 2020-08-15 — End: 2020-08-15

## 2020-08-15 NOTE — ED Notes
7:40 AM Assumed care. Pt sleeping. NAD/NARD. [id number]: Pt crying sitting on bed. Stated she did not want Zyprexa because she did not like how it made her feel. After explaining reason for medication, pt agreed to take it. Visualized pt swallowing medications. Confirmed medications were swallowed by checking mouth. Pt stated I just want to get out of here. Door closed d/t other BHU pt roaming hall and standing in front of BHU1's door. Safety maintained.

## 2020-08-15 NOTE — ED Notes
Handoff given to EMS crew. Pt belongings given to her. Safely left with EMS crew

## 2020-08-15 NOTE — ED Notes
10:02 PM PT A&O, behavior appropriate to situation, denies SI/ HI. Pt mentioned that she feels much better being here in the quiet. With hesitation took her zyprexa, mentioned that she wishes to talk with someone about stoping taking it and it makes her feel foggy.

## 2020-08-15 NOTE — ED Notes
6:55 AM pt report given to day RN, care deferred plan to go IP facility covid swab completed at 9 PM overnight second swab also negative

## 2020-08-15 NOTE — Other
REVIEW OF PHARMACY RECORDS FOR BHU PATIENT I have reviewed the pharmacy records for recent medications being filled at the pharmacy for this patient. Please see below for medication name, strength, formulation, dosage, route, frequency, fill date, and day supply.  Medication Sig Pharmacy Fill History Records Fluoxetine HCl 10 mg tablet Take one tablet by mouth at bedtime as directed. Start with one-half tablet for 2 days.  Last filled on 07/06/2020, #30 tablets for 30-day supply.  Kamdin Follett Myrene Galas, PharmD10:35 AM9/24/2021Phone: Available via Mobile [employer]

## 2020-08-15 NOTE — ED Notes
Patient calm and cooperative. Father visited patient without incident. Patient slept through the night.

## 2020-08-15 NOTE — Other
[facility]- patient accepted for admission. Patient to arrive [address] Authorized Paula Libra [phone] opt 3Authorization # [id number]# of day 3Starting 08/15/20 -Ending 08/17/20 , Review 08/18/20 Spoke with patient and father - explained role and transfer to in-patient psych admission. Patient and father accepted. Nursing report- [phone]- per Fleet [facility]ras- ready Mal Amabile Psych Case Management [phone]
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
## 2020-09-06 NOTE — Other
[facility] EMERGENCY DEPTEmergency Department Psychiatry Progress Note9/24/2021Patient Name:  Brooke StaffordMRN:  [id number] Patient Class:  ObservationSUBJECTIVE Interim History: Reviewed overnight clinical entries. Met with patient and father this morning. Discussed with clinical team. Patient remained depressed, quiet and withdrawn. Although patient is in better behavioral control she still remained unpredictable. She was accepted to inpatient psychiatric treatment at [facility]. Will be transported today via ambulance.Review of Systems Psychiatric/Behavioral: Positive for agitation, behavioral problems, decreased concentration, dysphoric mood and suicidal ideas. The patient is nervous/anxious.  OBJECTIVE I have reviewed the patient's current medication regimen, treatment options and transfer to inpatient psychiatric unit at [facility] MedicationsCurrent Facility-Administered Medications Medication Dose Route Frequency Provider Last Rate Last Admin ? FLUoxetine (PROzac) capsule 10 mg  10 mg Oral Daily Clemmie Buelna, MD   10 mg at 08/15/20 0901 ? OLANZapine (ZyPREXA) tablet 2.5 mg  2.5 mg Oral BID Nichol Ator, MD   2.5 mg at 08/15/20 0901 No current outpatient medications on file. Vital SignsVitals:  08/15/20 0601 BP: 128/84 Pulse: (!) 102 Resp: 16 Temp: 97.5 ?F (36.4 ?C) Mental Status ExamGeneral AppearanceHabitus:  ThinGrooming:  FairMusculoskeletalStrength and Tone: Strength normalGait and Station: Stable gaitPsychiatricAttitude: UncooperativeMood: depressed, anxious, apathetic, irritableAffect: Congruent to reported moodThought Process: circumstantial, disorganized, thought blocking and impoverishedAssociations: loose associationsThought Content: paranoid delusions, ideas of referenceSuicidal Ideation: No current suicidal plan, ideation or intentHomicidal Ideation: No current homicidal ideation, plan or intentJudgment:  PoorInsight:  PoorCognitive EvaluationOrientation: Oriented to person, oriented to place and oriented to date/time Attention and Concentration:  Decreased attention and concentrationAbstract Reasoning: Decreased capacity for abstract reasoningLab Results (last 24 hours)Recent Results (from the past 24 hour(s)) COVID-19 Clearance or Disposition  Collection Time: 08/14/20  9:54 PM  Specimen: Nasopharynx; Viral Result Value Ref Range  SARS-CoV-2 RNA (COVID-19) Negative Negative ASSESSMENT [place] - Suicide Severity Screen    Most Recent Value Have you wished you were dead or wished you could go to sleep and not wake up? no Have you actually had any thoughts of killing yourself? no Have you ever done anything, started to do anything, or prepared to do anything to end your life? 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  Suicide Attempt:  No Prior Attempts  Presenting Symptoms:  Hopelessness or Despair:  Yes  Anxiety and/or Panic:  Yes  Psychosis:  Yes  Family History:      Defer for Further Assessment  Precipitants / Stressors:      Unable to Assess  Change in Treatment:       Unable to AssessCurrent and Past Psychiatric Diagnoses:  Mood Disorder:  Recurrent/Current  Psychotic Disorder:  New Onset  Alcohol/Substance Abuse Disorders:  Recurrent/Current  ADHD:  Recurrent/Current  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  Suicide Attempt:  No Prior AttemptsProtective Factors:   Internal:      Unable to Assess  External:      Unable to AssessRisk to Self - Self-Injurious Behavior:   Current Urges to harm Self:  No  Recent Self-Injury:  No  History of Self Injury:  No  Imminent Risk for Self Injury in Community:  Low  Imminent Risk for Self Injury in Facility:  LowRisk to Others:   Current Agitation:  No  Homicidal/Aggressive Ideation:  No  Homicidal/Aggressive Threat/Plan:  No  Recent Violence/Aggression:  No  Imminent Risk for Violence in Community:  Low  Imminent Risk for Violence in Facility:  LowWithdrawal Risk:   Alcohol / Benzodiazepines / Barbiturates:  Not applicable     Alcohol/Benzodiazepine/Barbiturate Risk for Withdrawal:  Absent  Opioids:  Not applicable     Opioid Risk for Withdrawal:  AbsentPsychiatric DiagnosisActive Hospital Problems  Diagnosis ? 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 Alcohol use [Z72.89]   Chronic PLAN Continue with [facility]Continue Prozac 10 mg po daily- Zyprexa 5 mg po BIDLegal Status:  Physicians Emergency Certificate [facility]) - InpatientElectronically SignedProvider:  Steward Drone, MD9/24/2021

## 2020-09-24 ENCOUNTER — Inpatient Hospital Stay: Admit: 2020-09-24 | Discharge: 2020-09-24 | Payer: PRIVATE HEALTH INSURANCE | Primary: Adolescent Medicine

## 2020-09-24 DIAGNOSIS — Z20828 Contact with and (suspected) exposure to other viral communicable diseases: Secondary | ICD-10-CM

## 2020-09-25 DIAGNOSIS — Z20822 Contact with and (suspected) exposure to covid-19: Secondary | ICD-10-CM

## 2020-09-25 LAB — COVID-19 CLEARANCE OR FOR PLACEMENT ONLY: BKR SARS-COV-2 RNA (COVID-19) (YH): NOT DETECTED % (ref 0.0–2.0)

## 2020-10-13 ENCOUNTER — Inpatient Hospital Stay: Admit: 2020-10-13 | Discharge: 2020-10-13 | Payer: PRIVATE HEALTH INSURANCE | Primary: Adolescent Medicine

## 2020-10-13 DIAGNOSIS — Z20828 Contact with and (suspected) exposure to other viral communicable diseases: Secondary | ICD-10-CM

## 2020-10-13 DIAGNOSIS — Z20822 Contact with and (suspected) exposure to covid-19: Secondary | ICD-10-CM

## 2020-10-14 LAB — COVID-19 CLEARANCE OR FOR PLACEMENT ONLY: BKR SARS-COV-2 RNA (COVID-19) (YH): NOT DETECTED x 1000/??L (ref 0.0–0.2)
